# Patient Record
Sex: Male | Born: 1935 | Race: Black or African American | Hispanic: No | Smoking: Former smoker
Health system: Southern US, Community
[De-identification: ages and names within clinical notes are randomized; demographics above are authoritative.]

## PROBLEM LIST (undated history)

## (undated) DIAGNOSIS — F0390 Unspecified dementia without behavioral disturbance: Secondary | ICD-10-CM

## (undated) DIAGNOSIS — E119 Type 2 diabetes mellitus without complications: Secondary | ICD-10-CM

## (undated) DIAGNOSIS — N289 Disorder of kidney and ureter, unspecified: Secondary | ICD-10-CM

## (undated) DIAGNOSIS — C801 Malignant (primary) neoplasm, unspecified: Secondary | ICD-10-CM

## (undated) DIAGNOSIS — C419 Malignant neoplasm of bone and articular cartilage, unspecified: Secondary | ICD-10-CM

## (undated) DIAGNOSIS — I10 Essential (primary) hypertension: Secondary | ICD-10-CM

## (undated) HISTORY — PX: TONSILLECTOMY: SUR1361

## (undated) HISTORY — PX: EYE SURGERY: SHX253

---

## 2016-10-10 ENCOUNTER — Emergency Department
Admission: EM | Admit: 2016-10-10 | Discharge: 2016-10-10 | Disposition: A | Discharge status: Home / Self Care | Payer: Medicare Other | Attending: Student in an Organized Health Care Education/Training Program | Admitting: Student in an Organized Health Care Education/Training Program

## 2016-10-10 ENCOUNTER — Emergency Department: Payer: Medicare Other

## 2016-10-10 ENCOUNTER — Encounter: Payer: Self-pay | Admitting: Emergency Medicine

## 2016-10-10 DIAGNOSIS — Y9389 Activity, other specified: Secondary | ICD-10-CM | POA: Diagnosis not present

## 2016-10-10 DIAGNOSIS — Y9289 Other specified places as the place of occurrence of the external cause: Secondary | ICD-10-CM | POA: Insufficient documentation

## 2016-10-10 DIAGNOSIS — N189 Chronic kidney disease, unspecified: Secondary | ICD-10-CM | POA: Diagnosis not present

## 2016-10-10 DIAGNOSIS — Z87891 Personal history of nicotine dependence: Secondary | ICD-10-CM | POA: Insufficient documentation

## 2016-10-10 DIAGNOSIS — S0990XA Unspecified injury of head, initial encounter: Secondary | ICD-10-CM | POA: Insufficient documentation

## 2016-10-10 DIAGNOSIS — C7951 Secondary malignant neoplasm of bone: Secondary | ICD-10-CM | POA: Diagnosis not present

## 2016-10-10 DIAGNOSIS — E1122 Type 2 diabetes mellitus with diabetic chronic kidney disease: Secondary | ICD-10-CM | POA: Diagnosis not present

## 2016-10-10 DIAGNOSIS — Z8546 Personal history of malignant neoplasm of prostate: Secondary | ICD-10-CM | POA: Insufficient documentation

## 2016-10-10 DIAGNOSIS — I129 Hypertensive chronic kidney disease with stage 1 through stage 4 chronic kidney disease, or unspecified chronic kidney disease: Secondary | ICD-10-CM | POA: Diagnosis not present

## 2016-10-10 DIAGNOSIS — W19XXXA Unspecified fall, initial encounter: Secondary | ICD-10-CM

## 2016-10-10 DIAGNOSIS — M25511 Pain in right shoulder: Secondary | ICD-10-CM | POA: Diagnosis not present

## 2016-10-10 DIAGNOSIS — W01190A Fall on same level from slipping, tripping and stumbling with subsequent striking against furniture, initial encounter: Secondary | ICD-10-CM | POA: Insufficient documentation

## 2016-10-10 DIAGNOSIS — C61 Malignant neoplasm of prostate: Secondary | ICD-10-CM

## 2016-10-10 DIAGNOSIS — Y999 Unspecified external cause status: Secondary | ICD-10-CM | POA: Insufficient documentation

## 2016-10-10 HISTORY — DX: Type 2 diabetes mellitus without complications: E11.9

## 2016-10-10 HISTORY — DX: Malignant neoplasm of bone and articular cartilage, unspecified: C41.9

## 2016-10-10 HISTORY — DX: Essential (primary) hypertension: I10

## 2016-10-10 HISTORY — DX: Unspecified dementia, unspecified severity, without behavioral disturbance, psychotic disturbance, mood disturbance, and anxiety: F03.90

## 2016-10-10 HISTORY — DX: Disorder of kidney and ureter, unspecified: N28.9

## 2016-10-10 HISTORY — DX: Malignant (primary) neoplasm, unspecified: C80.1

## 2016-10-10 MED ORDER — ACETAMINOPHEN 325 MG PO TABS
650.0000 mg | ORAL_TABLET | Freq: Once | ORAL | Status: AC
  Administered 2016-10-10: 650 mg via ORAL
  Filled 2016-10-10: qty 2

## 2016-10-10 NOTE — ED Triage Notes (Addendum)
Patient fell this morning at 0430,  and patient hit right side of head.  C/O pain to right temple.Denies LOC.  Skin warm and dry.  MAE equally and strong.  Patient's daughter helped patient up from the floor and patient c/o right temple pain.  Patietn is AAO x 2 .  Confused to time.  Per patient's daughter, patient is at baseline mental status.

## 2016-10-10 NOTE — ED Provider Notes (Signed)
[facility] Provider Note    First MD Initiated Contact with Patient 10/10/16 1834     (approximate)  I have reviewed the triage vital signs and the nursing notes.   HISTORY  Chief Complaint Fall    HPI Isaac [facility] is a 83 y.o. male history of prostate cancer and dementia presents with fall this morning after he tripped on his bed sheets and hit the side of a table with the right side of his head. States this happened early this morning. Daughter found him on the ground but was in no acute distress. She continued to monitor him throughout the day but the patient took a nap and then woke up complaining of pain in the right side of his head so she brought him in to be evaluated. Otherwise states he is behaving at baseline. Does have some underlying confusion secondary dementia but she states no change in behavior. No weakness or numbness.     Past Medical History:  Diagnosis Date  . Bone cancer ([place])   . Cancer [facility])    prostate cancer stage IV  . Dementia   . Diabetes mellitus without complication ([place])   . Hypertension   . Renal disorder    CKD   No family history on file. Past Surgical History:  Procedure Laterality Date  . EYE SURGERY     cataract  . TONSILLECTOMY     There are no active problems to display for this patient.     Prior to Admission medications   Not on File    Allergies Patient has no known allergies.    Social History Social History  Substance Use Topics  . Smoking status: Former Research scientist (life sciences)  . Smokeless tobacco: Never Used  . Alcohol use No    Review of Systems Patient denies headaches, rhinorrhea, blurry vision, numbness, shortness of breath, chest pain, edema, cough, abdominal pain, nausea, vomiting, diarrhea, dysuria, fevers, rashes or hallucinations unless otherwise stated above in HPI. ____________________________________________   PHYSICAL EXAM:  VITAL SIGNS: Vitals:   10/10/16  1736  BP: 140/60  Pulse: 92  Resp: 16  Temp: 98.5 F (36.9 C)    Constitutional: Alert and oriented x 2.  Pleasant and Well appearing Eyes: Conjunctivae are normal.  Head: no obvious trauma but + ttp of right temple.  Arteries are palpable and non tender.  No trismus, no clicking or instability of TMJ.   Nose: No congestion/rhinnorhea. Mouth/Throat: Mucous membranes are moist.   Neck: No stridor. Painless ROM. No C spine ttp Cardiovascular: Normal rate, regular rhythm. Grossly normal heart sounds.  Good peripheral circulation. Respiratory: Normal respiratory effort.  No retractions. Lungs CTAB. Gastrointestinal: Soft and nontender. No distention. No abdominal bruits. No CVA tenderness. Musculoskeletal: TTP of acromion or right shoulder.  No lower extremity tenderness nor edema.  No joint effusions. Neurologic:  Normal speech and language. No gross focal neurologic deficits are appreciated. No facial droop Skin:  Skin is warm, dry and intact. No rash noted. Psychiatric: Mood and affect are normal. Speech and behavior are normal.  ____________________________________________   LABS (all labs ordered are listed, but only abnormal results are displayed)  No results found for this or any previous visit (from the past 24 hour(s)). ____________________________________________   RADIOLOGY  I personally reviewed all radiographic images ordered to evaluate for the above acute complaints and reviewed radiology reports and findings.  These findings were personally discussed with the patient.  Please see medical record for radiology  report.  ____________________________________________   PROCEDURES  Procedure(s) performed:  Procedures    Critical Care performed: no ____________________________________________   INITIAL IMPRESSION / ASSESSMENT AND PLAN / ED COURSE  Pertinent labs & imaging results that were available during my care of the patient were reviewed by me and  considered in my medical decision making (see chart for details).  DDX: contusion, concussion, inch, fracture  [name] is a 83 y.o. who presents to the ED with mechanical fall as described above. Patient well-appearing and in no acute distress. Have recommended laboratory evaluation but in further discussion with the family she feels that he is otherwise acting at his baseline and would prefer not to have any IV sticks at this time. Does complain of right shoulder pain and pain of the right temple. CT imaging shows evidence of no acute intracranial abnormality but does show evidence of diffuse sclerotic and lytic lesions to the cervical spine. Patient has no signs or symptoms of fracture. He is neuro intact.  Clinical Course as of Oct 11 1943  Mon Oct 10, 2016  1928 I discussed the results of radiographic imaging with the patient and his daughter. States that she is aware of metastatic disease to the spine and shoulder and that he is currently receiving treatments at [facility] in [facility]. On reevaluation the patient has no neck pain at this time he is looking about the room in no acute distress he has noted neuro deficits. I recommended MRI to further evaluate for any evidence of epidural extension or fracture. The daughter would like to speak with her sister who is the medical POA for further decision-making as their primary goal at this point is comfort management.  [PR]    Clinical Course User Index [PR] Merlyn Lot, MD    ----------------------------------------- 7:46 PM on 10/10/2016 -----------------------------------------  Patient ambulating in no acute distress. The daughter is spoken with her sister and they wish to take the patient home without any further diagnostic testing at this time as he is otherwise asymptomatic.  Have discussed with the patient and available family all diagnostics and treatments performed thus far and all questions were answered to the best of  my ability. The patient demonstrates understanding and agreement with plan.  ____________________________________________   FINAL CLINICAL IMPRESSION(S) / ED DIAGNOSES  Final diagnoses:  Fall, initial encounter  Prostate cancer metastatic to bone [facility])      NEW MEDICATIONS STARTED DURING THIS VISIT:  New Prescriptions   No medications on file     Note:  This document was prepared using Dragon voice recognition software and may include unintentional dictation errors.    Merlyn Lot, MD 10/10/16 [phone]

## 2016-10-10 NOTE — ED Notes (Addendum)
Tripped, fell. C/o pain right side of head. Per family, pt at baseline

## 2016-10-10 NOTE — Discharge Instructions (Signed)
Please return for worsening pain, dizziness, numbness or tingling, chest pain, or for any other concerns or questions.

## 2017-02-06 DEATH — deceased

## 2017-12-11 IMAGING — CR DG SHOULDER 2+V*R*
2 series · 3 of 3 positions shown · non-contrast
Comparison: None.

CLINICAL DATA: Right shoulder pain since a fall this morning.

EXAM:
RIGHT SHOULDER - 2+ VIEW

[Series 1: shoulder grashey · 0.14mm/px · 2 of 2 slices shown]
[im 1/2]
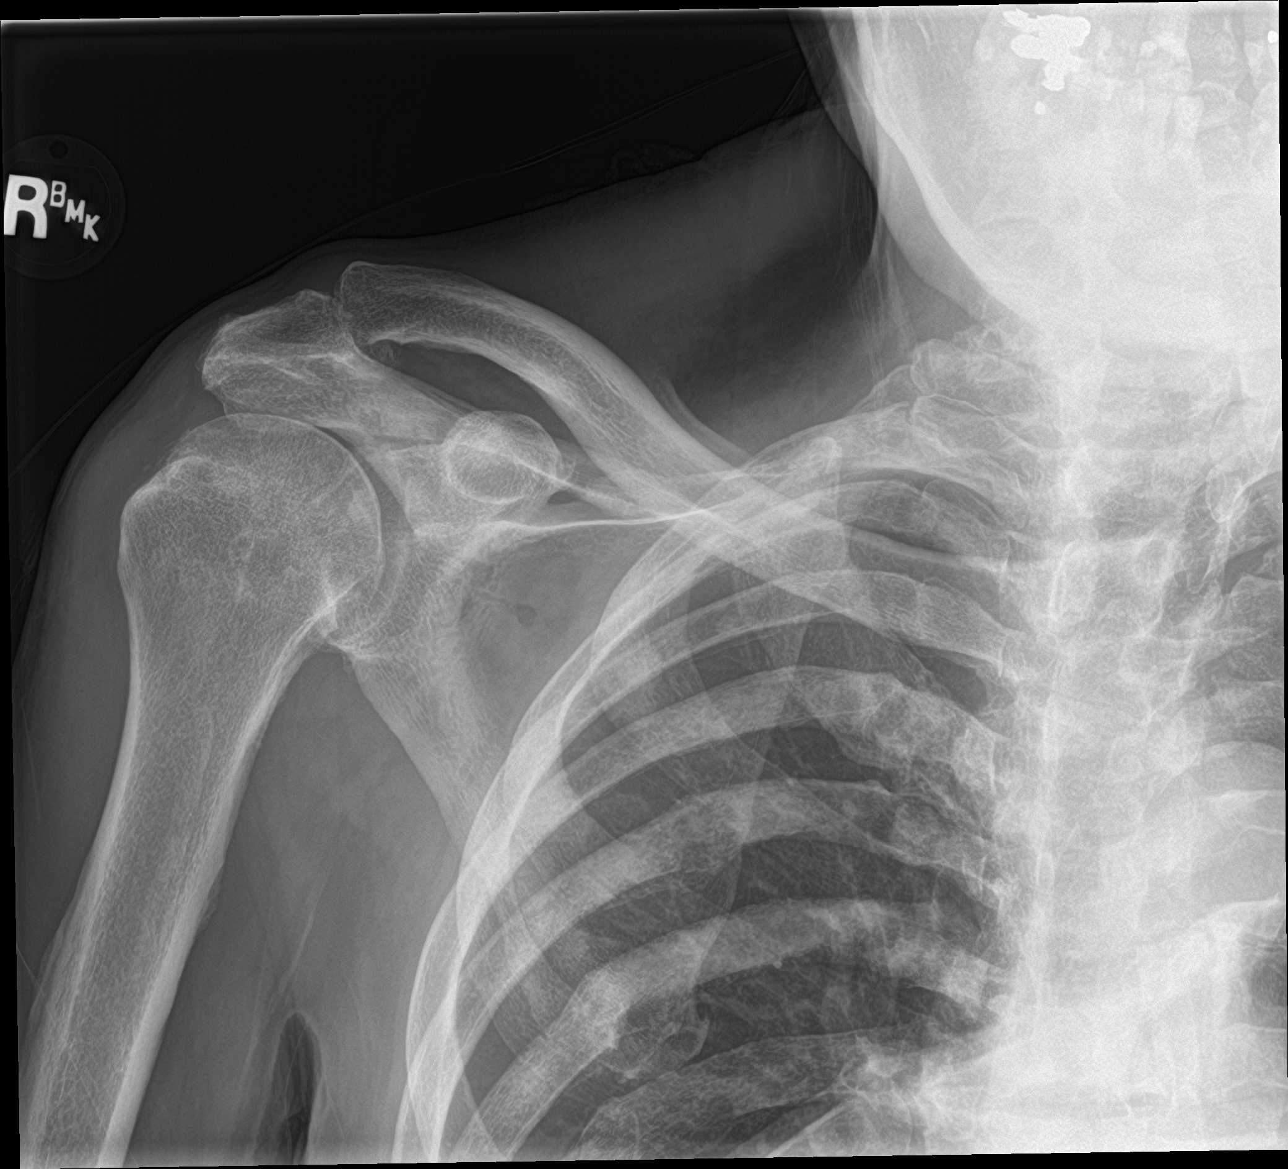
[im 2/2]
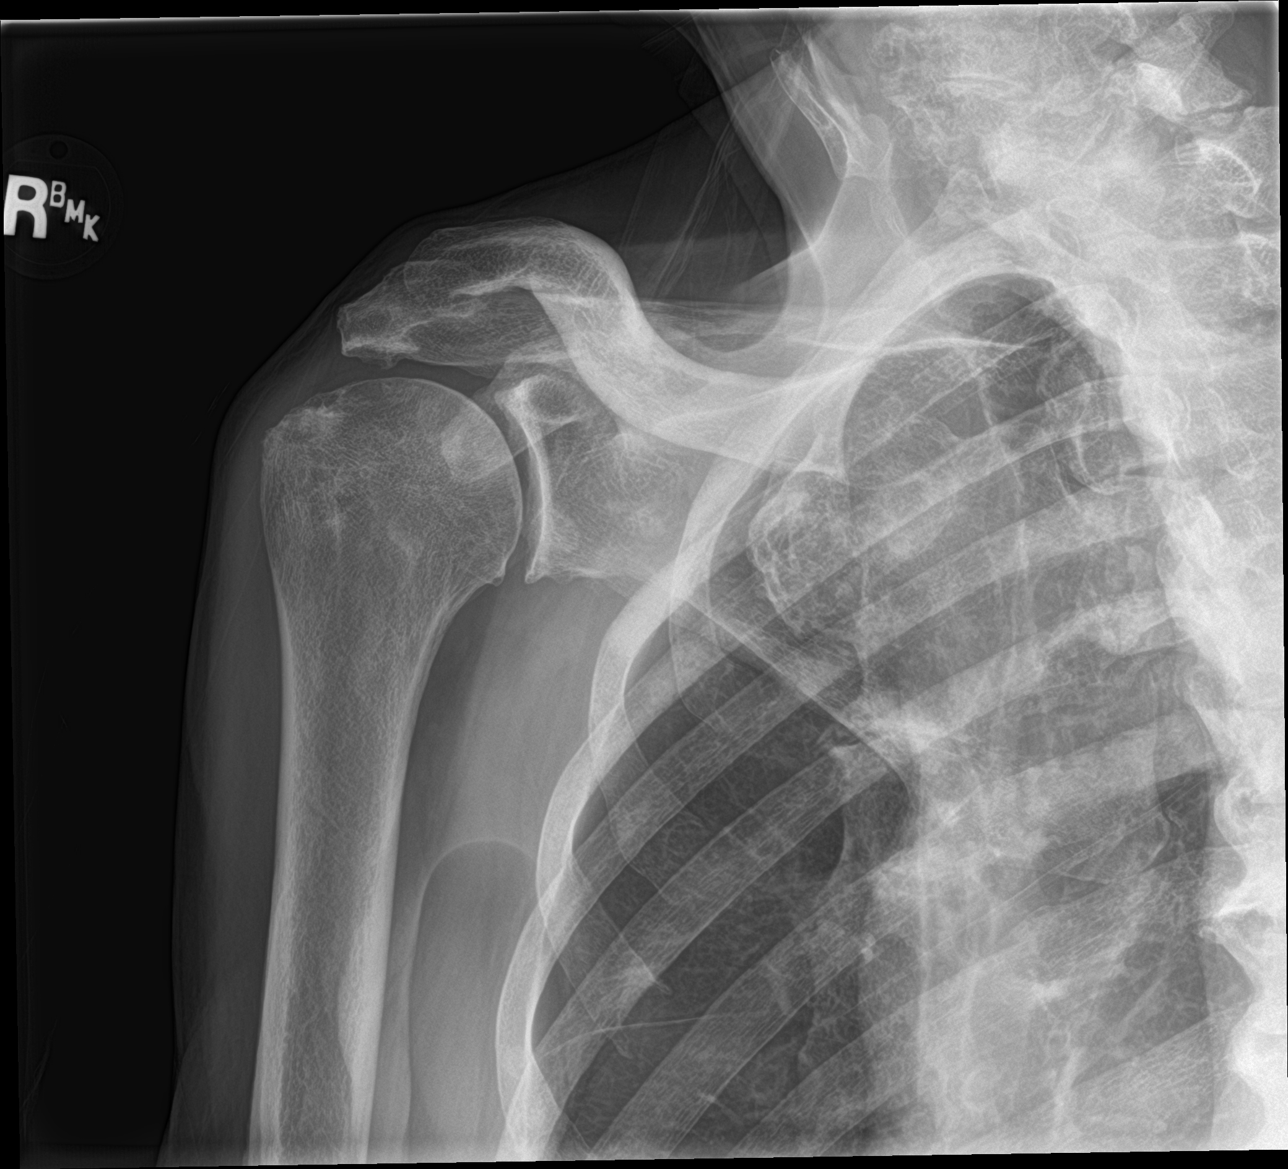

[shoulder y view]
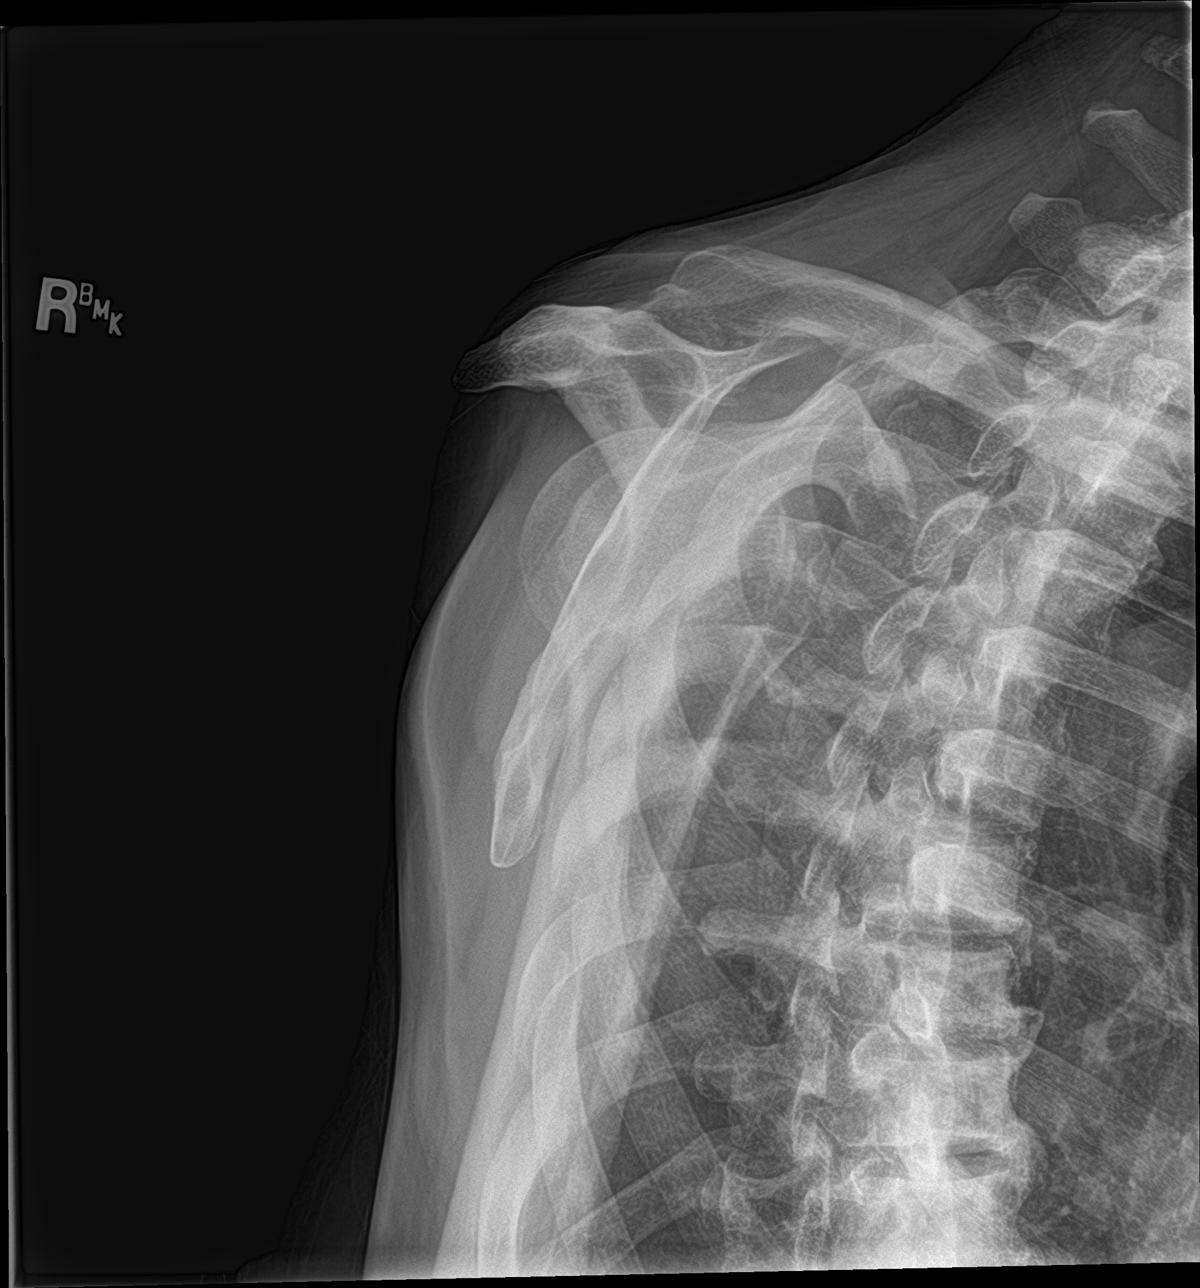

[3 of 3 positions shown; findings below may reference images not displayed]

FINDINGS: There are multiple sclerotic lesions in the bones as well as a
destructive lesion involving the posterior aspect of the right sixth
rib. There are multiple old right posterior rib fractures.

There is moderate arthritis at the right acromioclavicular joint.
There is no fracture or dislocation. There small sclerotic lesions
in the scapula.
IMPRESSION: No acute traumatic abnormality. Sclerotic and lytic lesions
worrisome for metastatic prostate cancer.

## 2017-12-11 IMAGING — CT CT HEAD W/O CM
5 of 7 series · 17 of 47 positions shown, 18 images · non-contrast
Comparison: None.

CLINICAL DATA: [AGE] male fell this morning. Hit right-side
of head. Denies loss of consciousness. Pain right temporal region.
Initial encounter.

EXAM:
CT HEAD WITHOUT CONTRAST
CT CERVICAL SPINE WITHOUT CONTRAST
TECHNIQUE: Multidetector CT imaging of the head and cervical spine was
performed following the standard protocol without intravenous
contrast. Multiplanar CT image reconstructions of the cervical spine
were also generated.

[Series 3: head wo · axial · 0.43mm/px · z∈[+1322,+1372]mm · 2 of 32 slices shown, 3 images]
[im 11/32  brain]
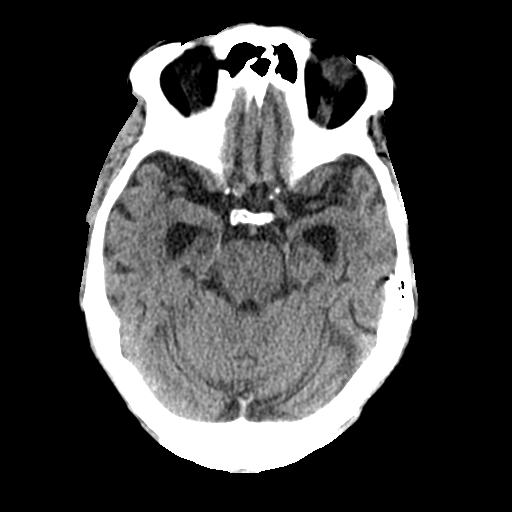
[im 11/32  bone]
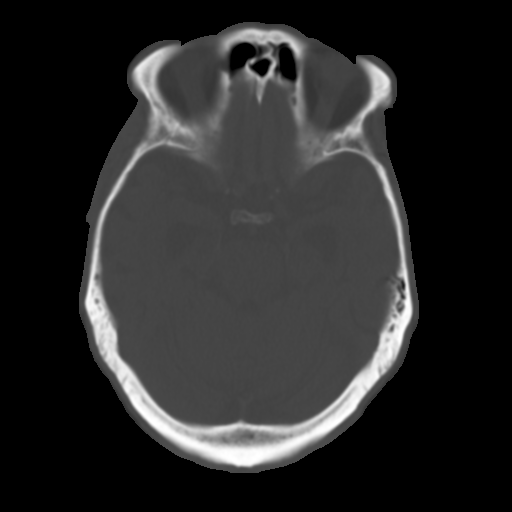
[im 21/32  brain]
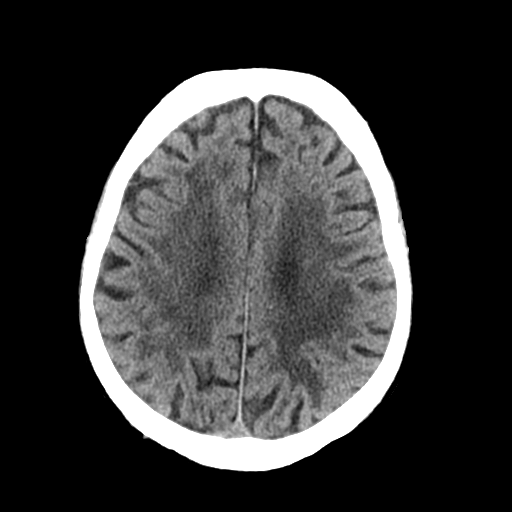

[Series 4: coronal soft tissue · coronal · 0.33mm/px · 3 of 67 slices shown]
[im 5/67  brain]
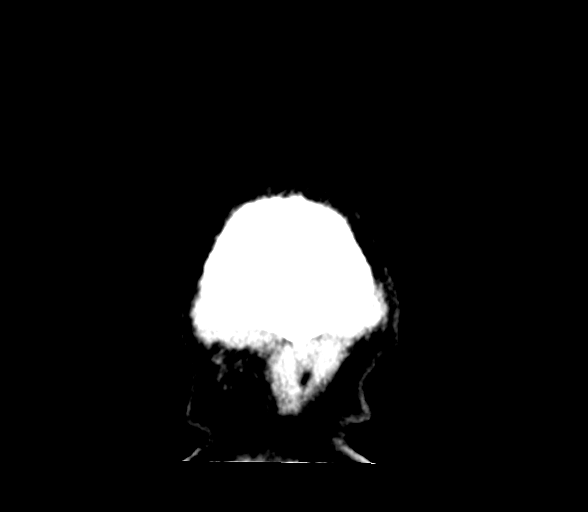
[im 7/67  brain]
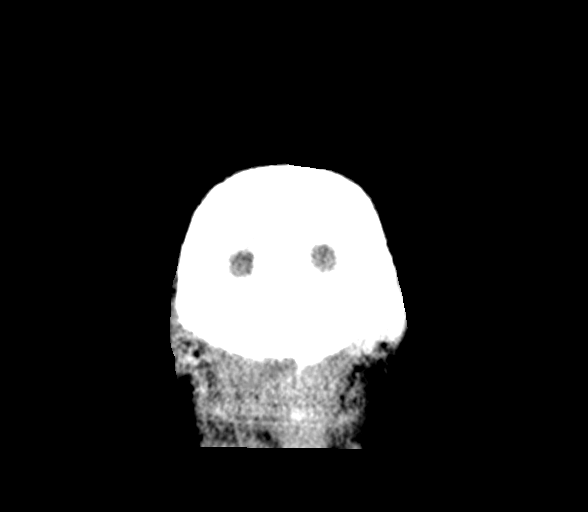
[im 9/67  brain]
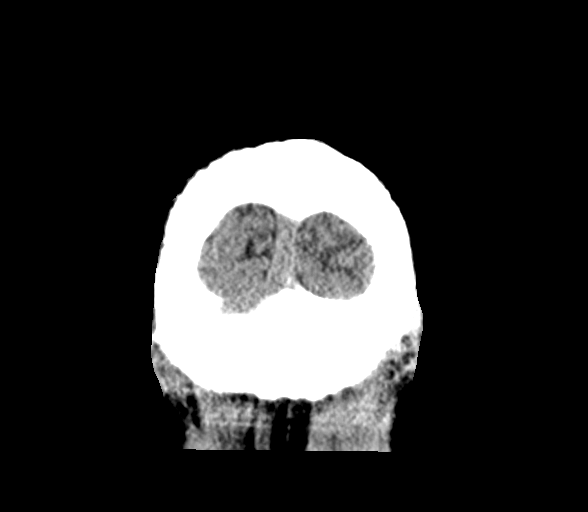

[Series 5: sagittal soft tissue · sagittal · 0.33mm/px · 2 of 54 slices shown]
[im 18/54  brain]
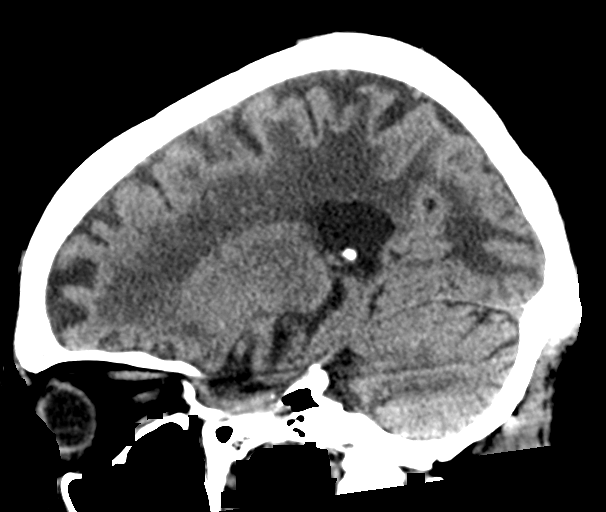
[im 36/54  brain]
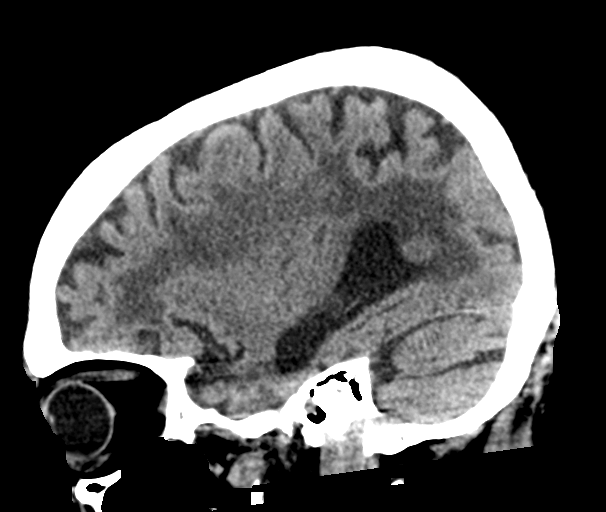

[Series 7: c spine soft · axial · 0.29mm/px · z∈[+1134,+1150]mm · 2 of 83 slices shown]
[im 9/83  brain]
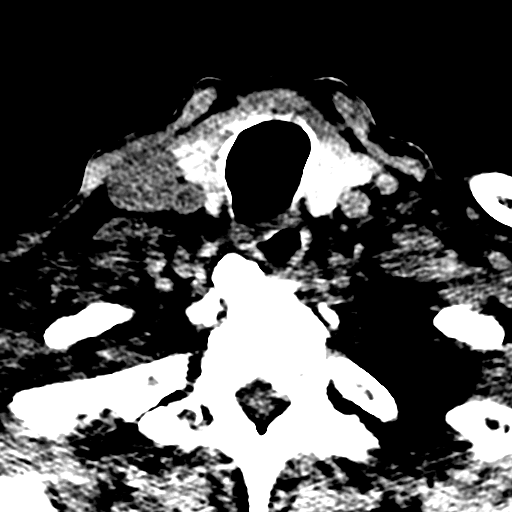
[im 17/83  brain]
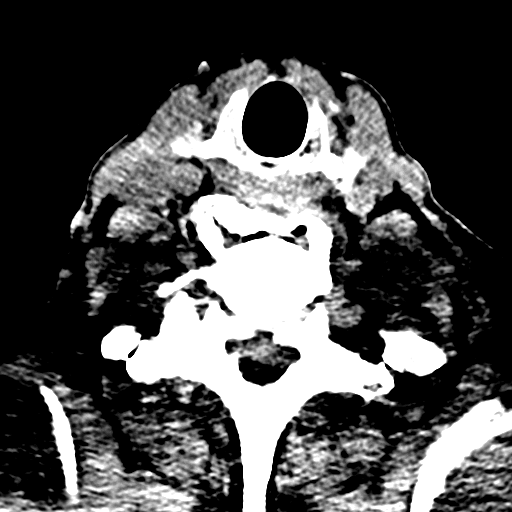

[Series 13: orthogonal bone · axial · 0.25mm/px · z∈[+1076,+1265]mm · 8 of 121 slices shown]
[im 9/121  bone]
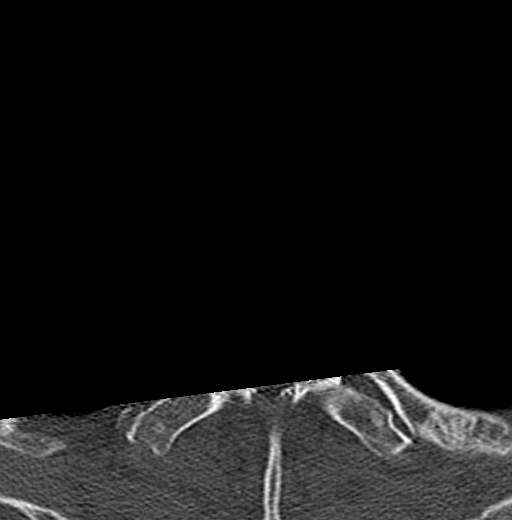
[im 26/121  bone]
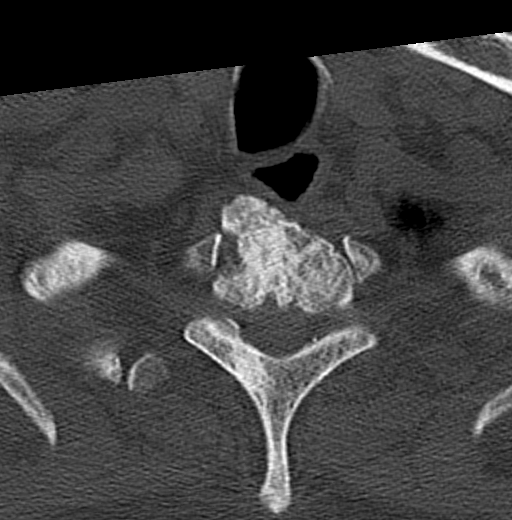
[im 43/121  bone]
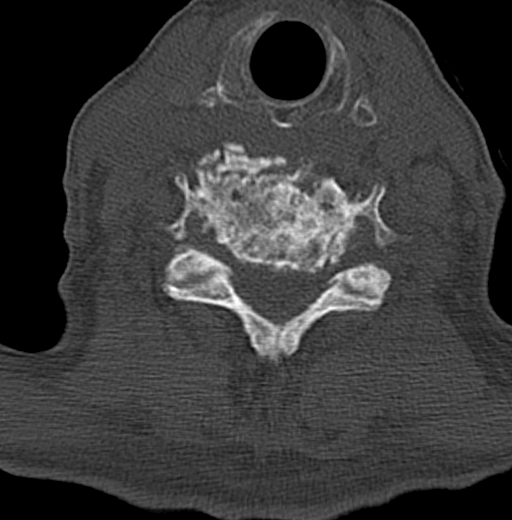
[im 52/121  bone]
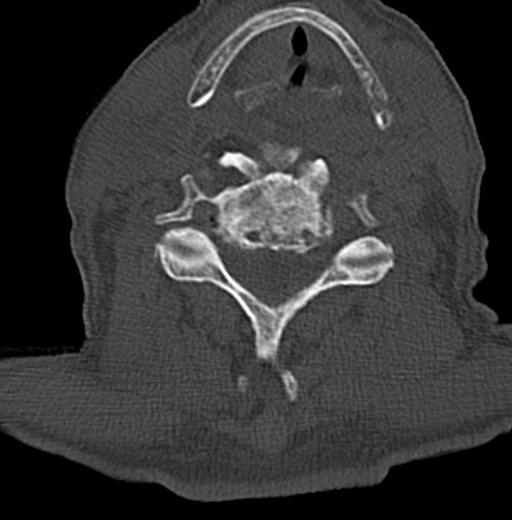
[im 69/121  bone]
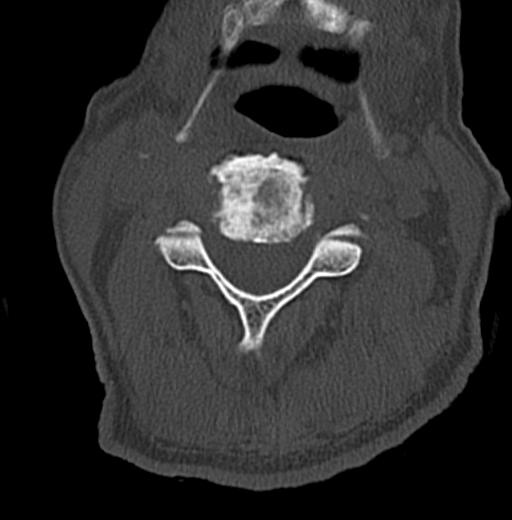
[im 78/121  bone]
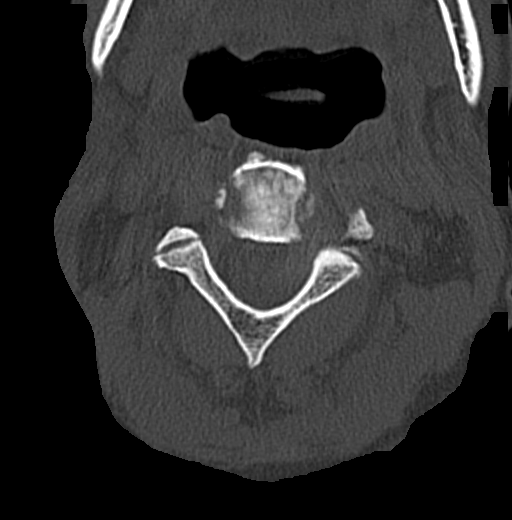
[im 95/121  bone]
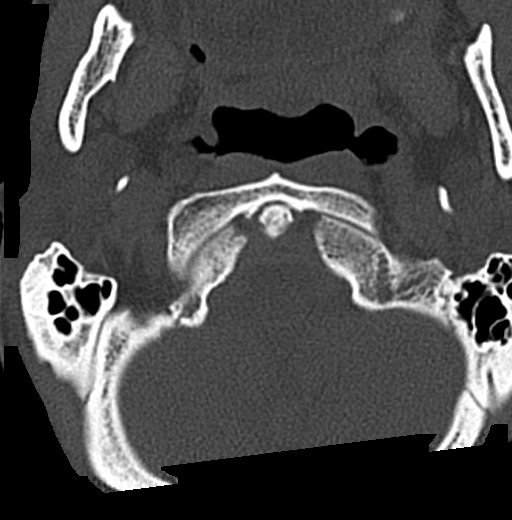
[im 112/121  bone]
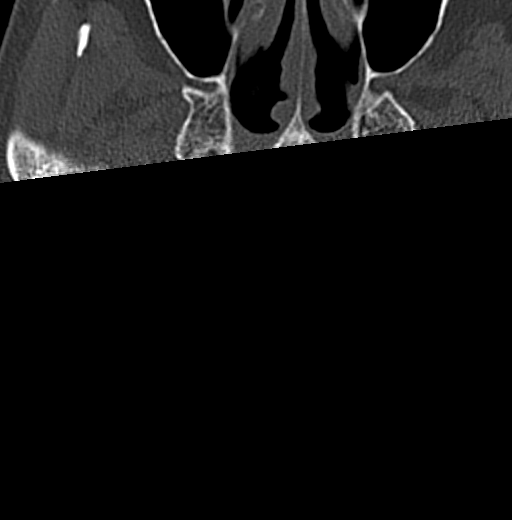

[17 of 47 positions shown; findings below may reference images not displayed]

FINDINGS: CT HEAD FINDINGS

Brain: No intracranial hemorrhage or CT evidence of large acute
infarct.

Prominent chronic microvascular changes.

Moderate global atrophy without hydrocephalus.

No intracranial mass lesion noted on this unenhanced exam.

Vascular: Vascular calcifications

Skull: No skull fracture

Sinuses/Orbits: Post right lens replacement. No acute orbital
abnormality. Visualized paranasal sinuses are clear.

Other: Mastoid air cells and middle ear cavities are clear.

CT CERVICAL SPINE FINDINGS

Alignment: Slight curvature

Skull base and vertebrae: Diffuse osseous metastatic disease
throughout the cervical spine and upper thoracic spine. Combination
of sclerotic and lytic destructive lesions. Most notable destructive
process involves the left C2 lateral mass. Given this appearance it
would be difficult to exclude a superimposed subtle pathologic
fracture. No other fractures noted.

Soft tissues and spinal canal: Difficult to evaluate for possibility
of epidural spread of tumor. C4-5 disc protrusion causes spinal
stenosis and cord flattening.

Upper chest: Negative

Other: Carotid calcification.
IMPRESSION: No skull fracture or intracranial hemorrhage.

Prominent chronic microvascular changes.

Diffuse osseous metastatic disease throughout the cervical spine and
upper thoracic spine. Combination of sclerotic and lytic destructive
lesions. Most notable destructive process involves the left C2
lateral mass. Given this appearance it would be difficult to exclude
a superimposed subtle pathologic fracture. No other fractures noted.

Difficult to evaluate for possibility of epidural spread of tumor.

C4-5 disc protrusion causes spinal stenosis and cord flattening.

These results were called by telephone at the time of interpretation
on 10/10/2016 at [DATE] to Dr. SAVIO LOCKLEAR , who verbally
acknowledged these results.
